# Patient Record
Sex: Male | Born: 1980 | Race: White | Hispanic: No | Marital: Single | Smoking: Current every day smoker
Health system: Southern US, Community
[De-identification: ages and names within clinical notes are randomized; demographics above are authoritative.]

## PROBLEM LIST (undated history)

## (undated) DIAGNOSIS — I10 Essential (primary) hypertension: Secondary | ICD-10-CM

---

## 2015-10-01 ENCOUNTER — Emergency Department (HOSPITAL_COMMUNITY)
Admission: EM | Admit: 2015-10-01 | Discharge: 2015-10-01 | Disposition: A | Discharge status: Home / Self Care | Payer: No Typology Code available for payment source | Attending: Emergency Medicine | Admitting: Emergency Medicine

## 2015-10-01 ENCOUNTER — Emergency Department (HOSPITAL_COMMUNITY): Payer: No Typology Code available for payment source

## 2015-10-01 ENCOUNTER — Encounter (HOSPITAL_COMMUNITY): Payer: Self-pay

## 2015-10-01 DIAGNOSIS — Y999 Unspecified external cause status: Secondary | ICD-10-CM | POA: Insufficient documentation

## 2015-10-01 DIAGNOSIS — Y929 Unspecified place or not applicable: Secondary | ICD-10-CM | POA: Insufficient documentation

## 2015-10-01 DIAGNOSIS — S161XXA Strain of muscle, fascia and tendon at neck level, initial encounter: Secondary | ICD-10-CM | POA: Insufficient documentation

## 2015-10-01 DIAGNOSIS — F17200 Nicotine dependence, unspecified, uncomplicated: Secondary | ICD-10-CM | POA: Diagnosis not present

## 2015-10-01 DIAGNOSIS — Y939 Activity, unspecified: Secondary | ICD-10-CM | POA: Diagnosis not present

## 2015-10-01 DIAGNOSIS — S46911A Strain of unspecified muscle, fascia and tendon at shoulder and upper arm level, right arm, initial encounter: Secondary | ICD-10-CM | POA: Diagnosis not present

## 2015-10-01 DIAGNOSIS — R51 Headache: Secondary | ICD-10-CM | POA: Insufficient documentation

## 2015-10-01 DIAGNOSIS — S199XXA Unspecified injury of neck, initial encounter: Secondary | ICD-10-CM | POA: Diagnosis present

## 2015-10-01 MED ORDER — DIATRIZOATE MEGLUMINE & SODIUM 66-10 % PO SOLN
ORAL | Status: AC
  Filled 2015-10-01: qty 30

## 2015-10-01 MED ORDER — CYCLOBENZAPRINE HCL 10 MG PO TABS: 10.0000 mg | ORAL_TABLET | Freq: Three times a day (TID) | ORAL | Status: AC | PRN

## 2015-10-01 MED ORDER — OXYCODONE-ACETAMINOPHEN 5-325 MG PO TABS
1.0000 | ORAL_TABLET | Freq: Once | ORAL | Status: AC
  Administered 2015-10-01: 1 via ORAL
  Filled 2015-10-01: qty 1

## 2015-10-01 MED ORDER — OXYCODONE-ACETAMINOPHEN 5-325 MG PO TABS: 1.0000 | ORAL_TABLET | ORAL | Status: AC | PRN

## 2015-10-01 MED ORDER — IBUPROFEN 800 MG PO TABS: 800.0000 mg | ORAL_TABLET | Freq: Three times a day (TID) | ORAL | Status: AC
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
## 2015-10-01 NOTE — Discharge Instructions (Signed)
Cervical Sprain °A cervical sprain is when the tissues (ligaments) that hold the neck bones in place stretch or tear. °HOME CARE  °· Put ice on the injured area. °¨ Put ice in a plastic bag. °¨ Place a towel between your skin and the bag. °¨ Leave the ice on for 15-20 minutes, 3-4 times a day. °· You may have been given a collar to wear. This collar keeps your neck from moving while you heal. °¨ Do not take the collar off unless told by your doctor. °¨ If you have long hair, keep it outside of the collar. °¨ Ask your doctor before changing the position of your collar. You may need to change its position over time to make it more comfortable. °¨ If you are allowed to take off the collar for cleaning or bathing, follow your doctor's instructions on how to do it safely. °¨ Keep your collar clean by wiping it with mild soap and water. Dry it completely. If the collar has removable pads, remove them every 1-2 days to hand wash them with soap and water. Allow them to air dry. They should be dry before you wear them in the collar. °¨ Do not drive while wearing the collar. °· Only take medicine as told by your doctor. °· Keep all doctor visits as told. °· Keep all physical therapy visits as told. °· Adjust your work station so that you have good posture while you work. °· Avoid positions and activities that make your problems worse. °· Warm up and stretch before being active. °GET HELP IF: °· Your pain is not controlled with medicine. °· You cannot take less pain medicine over time as planned. °· Your activity level does not improve as expected. °GET HELP RIGHT AWAY IF:  °· You are bleeding. °· Your stomach is upset. °· You have an allergic reaction to your medicine. °· You develop new problems that you cannot explain. °· You lose feeling (become numb) or you cannot move any part of your body (paralysis). °· You have tingling or weakness in any part of your body. °· Your symptoms get worse. Symptoms include: °· Pain,  soreness, stiffness, puffiness (swelling), or a burning feeling in your neck. °· Pain when your neck is touched. °· Shoulder or upper back pain. °· Limited ability to move your neck. °· Headache. °· Dizziness. °· Your hands or arms feel week, lose feeling, or tingle. °· Muscle spasms. °· Difficulty swallowing or chewing. °MAKE SURE YOU:  °· Understand these instructions. °· Will watch your condition. °· Will get help right away if you are not doing well or get worse. °  °This information is not intended to replace advice given to you by your health care provider. Make sure you discuss any questions you have with your health care provider. °  °Document Released: 10/29/2007 Document Revised: 01/12/2013 Document Reviewed: 11/17/2012 °Elsevier Interactive Patient Education ©2016 [employer] ° °Motor Vehicle Collision °After a car crash (motor vehicle collision), it is normal to have bruises and sore muscles. The first 24 hours usually feel the worst. After that, you will likely start to feel better each day. °HOME CARE °· Put ice on the injured area. °¨ Put ice in a plastic bag. °¨ Place a towel between your skin and the bag. °¨ Leave the ice on for 15-20 minutes, 03-04 times a day. °· Drink enough fluids to keep your pee (urine) clear or pale yellow. °· Do not drink alcohol. °· Take a warm shower   or bath 1 or 2 times a day. This helps your sore muscles. °· Return to activities as told by your doctor. Be careful when lifting. Lifting can make neck or back pain worse. °· Only take medicine as told by your doctor. Do not use aspirin. °GET HELP RIGHT AWAY IF:  °· Your arms or legs tingle, feel weak, or lose feeling (numbness). °· You have headaches that do not get better with medicine. °· You have neck pain, especially in the middle of the back of your neck. °· You cannot control when you pee (urinate) or poop (bowel movement). °· Pain is getting worse in any part of your body. °· You are short of breath, dizzy, or pass  out (faint). °· You have chest pain. °· You feel sick to your stomach (nauseous), throw up (vomit), or sweat. °· You have belly (abdominal) pain that gets worse. °· There is blood in your pee, poop, or throw up. °· You have pain in your shoulder (shoulder strap areas). °· Your problems are getting worse. °MAKE SURE YOU:  °· Understand these instructions. °· Will watch your condition. °· Will get help right away if you are not doing well or get worse. °  °This information is not intended to replace advice given to you by your health care provider. Make sure you discuss any questions you have with your health care provider. °  °Document Released: 10/29/2007 Document Revised: 08/04/2011 Document Reviewed: 10/09/2010 °Elsevier Interactive Patient Education ©2016 [employer] ° °
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
## 2015-10-04 ENCOUNTER — Emergency Department (HOSPITAL_COMMUNITY)
Admission: EM | Admit: 2015-10-04 | Discharge: 2015-10-04 | Disposition: A | Discharge status: Home / Self Care | Payer: No Typology Code available for payment source | Attending: Emergency Medicine | Admitting: Emergency Medicine

## 2015-10-04 ENCOUNTER — Encounter (HOSPITAL_COMMUNITY): Payer: Self-pay | Admitting: *Deleted

## 2015-10-04 DIAGNOSIS — F17200 Nicotine dependence, unspecified, uncomplicated: Secondary | ICD-10-CM | POA: Diagnosis not present

## 2015-10-04 DIAGNOSIS — M791 Myalgia: Secondary | ICD-10-CM | POA: Insufficient documentation

## 2015-10-04 DIAGNOSIS — Z791 Long term (current) use of non-steroidal anti-inflammatories (NSAID): Secondary | ICD-10-CM | POA: Diagnosis not present

## 2015-10-04 DIAGNOSIS — M7918 Myalgia, other site: Secondary | ICD-10-CM

## 2015-10-04 DIAGNOSIS — Z79899 Other long term (current) drug therapy: Secondary | ICD-10-CM | POA: Insufficient documentation

## 2015-10-04 DIAGNOSIS — M542 Cervicalgia: Secondary | ICD-10-CM | POA: Insufficient documentation

## 2015-10-04 DIAGNOSIS — R51 Headache: Secondary | ICD-10-CM | POA: Diagnosis present

## 2015-10-04 MED ORDER — HYDROMORPHONE HCL 1 MG/ML IJ SOLN
1.0000 mg | Freq: Once | INTRAMUSCULAR | Status: AC
  Administered 2015-10-04: 1 mg via INTRAMUSCULAR
  Filled 2015-10-04: qty 1

## 2015-10-04 MED ORDER — METHOCARBAMOL 500 MG PO TABS: 1000.0000 mg | ORAL_TABLET | Freq: Four times a day (QID) | ORAL | Status: AC | PRN

## 2015-10-04 MED ORDER — NAPROXEN 250 MG PO TABS: 250.0000 mg | ORAL_TABLET | Freq: Two times a day (BID) | ORAL | Status: AC | PRN

## 2015-10-04 MED ORDER — DIAZEPAM 5 MG PO TABS
5.0000 mg | ORAL_TABLET | Freq: Once | ORAL | Status: AC
  Administered 2015-10-04: 5 mg via ORAL
  Filled 2015-10-04: qty 1

## 2015-10-04 MED ORDER — HYDROCODONE-ACETAMINOPHEN 5-325 MG PO TABS: ORAL_TABLET | ORAL | Status: AC
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
## 2015-10-04 NOTE — Discharge Instructions (Signed)
Take the prescriptions as directed.  Apply moist heat or ice to the area(s) of discomfort, for 15 minutes at a time, several times per day for the next few days.  Do not fall asleep on a heating or ice pack.  Call your regular medical doctor tomorrow to schedule a follow up appointment in the next 2 days.  Return to the Emergency Department immediately if worsening. ° °
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
## 2016-11-14 ENCOUNTER — Encounter (HOSPITAL_COMMUNITY): Payer: Self-pay | Admitting: Emergency Medicine

## 2016-11-14 ENCOUNTER — Emergency Department (HOSPITAL_COMMUNITY): Payer: BLUE CROSS/BLUE SHIELD

## 2016-11-14 ENCOUNTER — Emergency Department (HOSPITAL_COMMUNITY)
Admission: EM | Admit: 2016-11-14 | Discharge: 2016-11-14 | Disposition: A | Discharge status: Home Health | Payer: BLUE CROSS/BLUE SHIELD | Attending: Emergency Medicine | Admitting: Emergency Medicine

## 2016-11-14 DIAGNOSIS — R55 Syncope and collapse: Secondary | ICD-10-CM | POA: Diagnosis not present

## 2016-11-14 DIAGNOSIS — R072 Precordial pain: Secondary | ICD-10-CM | POA: Diagnosis not present

## 2016-11-14 DIAGNOSIS — F17210 Nicotine dependence, cigarettes, uncomplicated: Secondary | ICD-10-CM | POA: Insufficient documentation

## 2016-11-14 DIAGNOSIS — Z79899 Other long term (current) drug therapy: Secondary | ICD-10-CM | POA: Diagnosis not present

## 2016-11-14 DIAGNOSIS — I10 Essential (primary) hypertension: Secondary | ICD-10-CM | POA: Insufficient documentation

## 2016-11-14 HISTORY — DX: Essential (primary) hypertension: I10

## 2016-11-14 LAB — COMPREHENSIVE METABOLIC PANEL
ALT: 30 U/L (ref 17–63)
AST: 28 U/L (ref 15–41)
Albumin: 3.8 g/dL (ref 3.5–5.0)
Alkaline Phosphatase: 66 U/L (ref 38–126)
Anion gap: 9 (ref 5–15)
BUN: 8 mg/dL (ref 6–20)
CHLORIDE: 107 mmol/L (ref 101–111)
CO2: 24 mmol/L (ref 22–32)
CREATININE: 0.95 mg/dL (ref 0.61–1.24)
Calcium: 8.9 mg/dL (ref 8.9–10.3)
GFR calc non Af Amer: >60 mL/min (ref >60)
Glucose, Bld: 109 mg/dL — ABNORMAL HIGH (ref 65–99)
POTASSIUM: 3.5 mmol/L (ref 3.5–5.1)
SODIUM: 140 mmol/L (ref 135–145)
Total Bilirubin: 0.6 mg/dL (ref 0.3–1.2)
Total Protein: 7 g/dL (ref 6.5–8.1)

## 2016-11-14 LAB — CBC
HCT: 44.2 % (ref 39.0–52.0)
Hemoglobin: 14.7 g/dL (ref 13.0–17.0)
MCH: 30.2 pg (ref 26.0–34.0)
MCHC: 33.3 g/dL (ref 30.0–36.0)
MCV: 90.8 fL (ref 78.0–100.0)
PLATELETS: 170 10*3/uL (ref 150–400)
RBC: 4.87 MIL/uL (ref 4.22–5.81)
RDW: 13.2 % (ref 11.5–15.5)
WBC: 9 10*3/uL (ref 4.0–10.5)

## 2016-11-14 LAB — I-STAT TROPONIN, ED: Troponin i, poc: 0 ng/mL (ref 0.00–0.08)

## 2016-11-14 MED ORDER — ALUM & MAG HYDROXIDE-SIMETH 200-200-20 MG/5ML PO SUSP
15.0000 mL | Freq: Once | ORAL | Status: AC
  Administered 2016-11-14: 15 mL via ORAL
  Filled 2016-11-14: qty 30

## 2016-11-14 MED ORDER — ACETAMINOPHEN 500 MG PO TABS
1000.0000 mg | ORAL_TABLET | Freq: Once | ORAL | Status: AC
  Administered 2016-11-14: 1000 mg via ORAL
  Filled 2016-11-14: qty 2

## 2016-11-14 MED ORDER — FAMOTIDINE 20 MG PO TABS
20.0000 mg | ORAL_TABLET | Freq: Once | ORAL | Status: AC
  Administered 2016-11-14: 20 mg via ORAL
  Filled 2016-11-14: qty 1
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
## 2016-11-14 NOTE — Discharge Instructions (Addendum)
It was our pleasure to provide your ER care today - we hope that you feel better.  Rest. Drink plenty of fluids. Stay in cool environment for the remainder of the day today.  Avoid any smoking.   Follow up with cardiologist in the coming week - see referral - call office this afternoon, or tomorrow morning, to arrange appointment.   Return to ER right away if worse, recurrent/persistent chest pain, trouble breathing, weak/fainting, fevers, other concern.

## 2016-11-14 NOTE — ED Notes (Signed)
EDP at bedside  

## 2016-11-14 NOTE — ED Provider Notes (Signed)
[facility] DEPT Provider Note   CSN: [id number] Arrival date & time: 11/14/16  1215     History   Chief Complaint Chief Complaint  Patient presents with  . Chest Pain  . Near Syncope    HPI Jacob Reese is a 36 y.o. male.  Patient c/o 2 near syncopal events this week, both at work, working in [employer], felt faint and lightheaded, as if about to pass out.  Denies injury. No associated palpitations or sense of rapid or irregular heart beat. Today felt tightness in mid chest. At rest. No exertional cp or discomfort. No unusual fatigue or doe.  +family hx cad, including father prior to age 25.  Denies leg pain or swelling. No pleuritic pain. Denies cough or uri c/o. No fever or chills. No heartburn.  Is eating and drinking normally. No blood loss, rectal bleeding or melena. No recent new meds.    The history is provided by the patient.  Chest Pain   Associated symptoms include near-syncope. Pertinent negatives include no abdominal pain, no back pain, no cough, no fever, no headaches, no palpitations, no shortness of breath and no vomiting.  Near Syncope  Associated symptoms include chest pain. Pertinent negatives include no abdominal pain, no headaches and no shortness of breath.    Past Medical History:  Diagnosis Date  . Hypertension     There are no active problems to display for this patient.   History reviewed. No pertinent surgical history.     Home Medications    Prior to Admission medications   Medication Sig Start Date End Date Taking? Authorizing Provider  cyclobenzaprine (FLEXERIL) 10 MG tablet Take 1 tablet (10 mg total) by mouth 3 (three) times daily as needed. 10/01/15   Triplett, Tammy, PA-C  HYDROcodone-acetaminophen (NORCO/VICODIN) 5-325 MG tablet 1 or 2 tabs PO q6 hours prn pain 10/04/15   Samuel Jester, DO  ibuprofen (ADVIL,MOTRIN) 800 MG tablet Take 1 tablet (800 mg total) by mouth 3 (three) times daily. 10/01/15   Triplett, Tammy, PA-C    methocarbamol (ROBAXIN) 500 MG tablet Take 2 tablets (1,000 mg total) by mouth 4 (four) times daily as needed for muscle spasms (muscle spasm/pain). 10/04/15   Samuel Jester, DO  naproxen (NAPROSYN) 250 MG tablet Take 1 tablet (250 mg total) by mouth 2 (two) times daily as needed for mild pain or moderate pain (take with food). 10/04/15   Samuel Jester, DO  oxyCODONE-acetaminophen (PERCOCET/ROXICET) 5-325 MG tablet Take 1 tablet by mouth every 4 (four) hours as needed. 10/01/15   Pauline Aus, PA-C    Family History History reviewed. No pertinent family history.  Social History Social History  Substance Use Topics  . Smoking status: Current Every Day Smoker    Packs/day: 1.00    Types: Cigarettes  . Smokeless tobacco: Never Used  . Alcohol use Yes     Comment: socially     Allergies   Patient has no known allergies.   Review of Systems Review of Systems  Constitutional: Negative for chills and fever.  HENT: Negative for sore throat.   Eyes: Negative for redness.  Respiratory: Negative for cough and shortness of breath.   Cardiovascular: Positive for chest pain and near-syncope. Negative for palpitations and leg swelling.  Gastrointestinal: Negative for abdominal pain, blood in stool and vomiting.  Genitourinary: Negative for flank pain.  Musculoskeletal: Negative for back pain and neck pain.  Skin: Negative for rash.  Neurological: Negative for headaches.  Hematological: Does not bruise/bleed easily.  Psychiatric/Behavioral: Negative for confusion.     Physical Exam Updated Vital Signs BP (!) 125/93 (BP Location: Right Arm)   Pulse 63   Temp 99.5 F (37.5 C) (Oral)   Resp 18   Ht 1.88 m (6\' 2" )   Wt (!) 147.4 kg (325 lb)   SpO2 96%   BMI 41.73 kg/m   Physical Exam  Constitutional: He is oriented to person, place, and time. He appears well-developed and well-nourished. No distress.  HENT:  Head: Atraumatic.  Mouth/Throat: Oropharynx is clear and  moist.  Eyes: Conjunctivae are normal. Pupils are equal, round, and reactive to light.  Neck: Neck supple. No tracheal deviation present. No thyromegaly present.  Cardiovascular: Normal rate, regular rhythm, normal heart sounds and intact distal pulses.  Exam reveals no gallop and no friction rub.   No murmur heard. Pulmonary/Chest: Effort normal and breath sounds normal. No accessory muscle usage. No respiratory distress. He exhibits no tenderness.  Abdominal: Soft. Bowel sounds are normal. He exhibits no distension. There is no tenderness.  Genitourinary:  Genitourinary Comments: No cva tenderness.   Musculoskeletal: He exhibits no edema or tenderness.  Neurological: He is alert and oriented to person, place, and time.  Speech clear/fluent. Ambulates w steady gait.   Skin: Skin is warm and dry. He is not diaphoretic.  Psychiatric: He has a normal mood and affect.  Nursing note and vitals reviewed.    ED Treatments / Results  Labs (all labs ordered are listed, but only abnormal results are displayed)  Results for orders placed or performed during the hospital encounter of 11/14/16  CBC  Result Value Ref Range   WBC 9.0 4.0 - 10.5 K/uL   RBC 4.87 4.22 - 5.81 MIL/uL   Hemoglobin 14.7 13.0 - 17.0 g/dL   HCT 16.144.2 09.639.0 - 04.552.0 %   MCV 90.8 78.0 - 100.0 fL   MCH 30.2 26.0 - 34.0 pg   MCHC 33.3 30.0 - 36.0 g/dL   RDW 40.913.2 81.111.5 - 91.415.5 %   Platelets 170 150 - 400 K/uL  Comprehensive metabolic panel  Result Value Ref Range   Sodium 140 135 - 145 mmol/L   Potassium 3.5 3.5 - 5.1 mmol/L   Chloride 107 101 - 111 mmol/L   CO2 24 22 - 32 mmol/L   Glucose, Bld 109 (H) 65 - 99 mg/dL   BUN 8 6 - 20 mg/dL   Creatinine, Ser 7.820.95 0.61 - 1.24 mg/dL   Calcium 8.9 8.9 - 95.610.3 mg/dL   Total Protein 7.0 6.5 - 8.1 g/dL   Albumin 3.8 3.5 - 5.0 g/dL   AST 28 15 - 41 U/L   ALT 30 17 - 63 U/L   Alkaline Phosphatase 66 38 - 126 U/L   Total Bilirubin 0.6 0.3 - 1.2 mg/dL   GFR calc non Af Amer >60 >60  mL/min   GFR calc Af Amer >60 >60 mL/min   Anion gap 9 5 - 15  I-stat troponin, ED  Result Value Ref Range   Troponin i, poc 0.00 0.00 - 0.08 ng/mL   Comment 3           Dg Chest 2 View  Result Date: 11/14/2016 CLINICAL DATA:  Chest pain, midsternal pain EXAM: CHEST  2 VIEW COMPARISON:  None. FINDINGS: The heart size and mediastinal contours are within normal limits. Both lungs are clear. The visualized skeletal structures are unremarkable. IMPRESSION: No active cardiopulmonary disease. Electronically Signed   By: Elige KoHetal  Patel  On: 11/14/2016 13:39      EKG  EKG Interpretation  Date/Time:  Friday November 14 2016 12:23:38 EDT Ventricular Rate:  65 PR Interval:    QRS Duration: 97 QT Interval:  401 QTC Calculation: 417 R Axis:   -13 Text Interpretation:  Sinus rhythm Low voltage, precordial leads No previous tracing Confirmed by Cathren Laine ([id number]) on 11/14/2016 12:27:48 PM       Radiology Dg Chest 2 View  Result Date: 11/14/2016 CLINICAL DATA:  Chest pain, midsternal pain EXAM: CHEST  2 VIEW COMPARISON:  None. FINDINGS: The heart size and mediastinal contours are within normal limits. Both lungs are clear. The visualized skeletal structures are unremarkable. IMPRESSION: No active cardiopulmonary disease. Electronically Signed   By: Elige Ko   On: 11/14/2016 13:39    Procedures Procedures (including critical care time)  Medications Ordered in ED Medications  famotidine (PEPCID) tablet 20 mg (20 mg Oral Given 11/14/16 1245)  alum & mag hydroxide-simeth (MAALOX/MYLANTA) 200-200-20 MG/5ML suspension 15 mL (15 mLs Oral Given 11/14/16 1245)  acetaminophen (TYLENOL) tablet 1,000 mg (1,000 mg Oral Given 11/14/16 1245)     Initial Impression / Assessment and Plan / ED Course  I have reviewed the triage vital signs and the nursing notes.  Pertinent labs & imaging results that were available during my care of the patient were reviewed by me and considered in my medical decision  making (see chart for details).  Iv ns. Labs. ecg  Cxr.  Reviewed nursing notes and prior charts for additional history.   Po fluids.  Pepcid, maalox and acetaminophen for symptom relief.   After symptoms in past few days, trop negative or 0.   No current cp or sob. nsr on monitor. Vitals normal.  Given recent symptoms, fam hx, will refer to outpt cardiology eval, possible stress testing.   Patient currently appears stable for d/c.     Final Clinical Impressions(s) / ED Diagnoses   Final diagnoses:  None    New Prescriptions New Prescriptions   No medications on file     Cathren Laine, MD 11/14/16 1413

## 2016-11-14 NOTE — ED Triage Notes (Signed)
Reports syncope at work on Monday, went to urgent care and bp was 220/120, went back to work today and thought he was going to pass out.  Began having chest pain in center of chest with some sob.  Pt pale and clammy on arrival to ED.

## 2016-11-14 NOTE — ED Triage Notes (Signed)
Pt had syncopal episode  Seen at Urgent care Told htn- referred to PCP (new) he cannot be seen there until next month  Now dizzy  No PCP

## 2016-11-14 NOTE — ED Notes (Signed)
Pt states that he felt dizzy when he stood up for his x-ray.
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
## 2019-07-05 ENCOUNTER — Ambulatory Visit: Payer: Self-pay | Attending: Internal Medicine

## 2019-07-05 ENCOUNTER — Other Ambulatory Visit: Payer: Self-pay

## 2019-07-05 DIAGNOSIS — Z20822 Contact with and (suspected) exposure to covid-19: Secondary | ICD-10-CM | POA: Insufficient documentation

## 2019-07-06 LAB — NOVEL CORONAVIRUS, NAA: SARS-CoV-2, NAA: NOT DETECTED

## 2020-03-08 ENCOUNTER — Ambulatory Visit: Payer: Self-pay | Admitting: Dermatology
